# Patient Record
Sex: Female | Born: 1995 | Race: White | Hispanic: No | Marital: Married | Smoking: Never smoker
Health system: Southern US, Community
[De-identification: ages and names within clinical notes are randomized; demographics above are authoritative.]

---

## 2017-10-03 ENCOUNTER — Emergency Department (HOSPITAL_COMMUNITY): Payer: Managed Care, Other (non HMO)

## 2017-10-03 ENCOUNTER — Encounter (HOSPITAL_COMMUNITY): Payer: Self-pay | Admitting: Emergency Medicine

## 2017-10-03 DIAGNOSIS — Y998 Other external cause status: Secondary | ICD-10-CM | POA: Diagnosis not present

## 2017-10-03 DIAGNOSIS — S199XXA Unspecified injury of neck, initial encounter: Secondary | ICD-10-CM | POA: Diagnosis present

## 2017-10-03 DIAGNOSIS — Y9389 Activity, other specified: Secondary | ICD-10-CM | POA: Diagnosis not present

## 2017-10-03 DIAGNOSIS — S161XXA Strain of muscle, fascia and tendon at neck level, initial encounter: Secondary | ICD-10-CM | POA: Insufficient documentation

## 2017-10-03 DIAGNOSIS — R55 Syncope and collapse: Secondary | ICD-10-CM | POA: Insufficient documentation

## 2017-10-03 DIAGNOSIS — W500XXA Accidental hit or strike by another person, initial encounter: Secondary | ICD-10-CM | POA: Insufficient documentation

## 2017-10-03 DIAGNOSIS — Y9289 Other specified places as the place of occurrence of the external cause: Secondary | ICD-10-CM | POA: Insufficient documentation
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
## 2017-10-04 ENCOUNTER — Emergency Department (HOSPITAL_COMMUNITY)
Admission: EM | Admit: 2017-10-04 | Discharge: 2017-10-04 | Disposition: A | Discharge status: Home / Self Care | Payer: Managed Care, Other (non HMO) | Attending: Emergency Medicine | Admitting: Emergency Medicine

## 2017-10-04 DIAGNOSIS — S161XXA Strain of muscle, fascia and tendon at neck level, initial encounter: Secondary | ICD-10-CM

## 2017-10-04 DIAGNOSIS — R55 Syncope and collapse: Secondary | ICD-10-CM

## 2017-10-04 MED ORDER — IBUPROFEN 800 MG PO TABS
800.0000 mg | ORAL_TABLET | Freq: Once | ORAL | Status: AC
  Administered 2017-10-04: 800 mg via ORAL
  Filled 2017-10-04: qty 1

## 2017-10-04 NOTE — Discharge Instructions (Addendum)
Apply ice several times a day. Take ibuprofen, naproxen, or acetaminophen as needed for pain. °
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
## 2018-11-09 IMAGING — DX DG CERVICAL SPINE COMPLETE 4+V
6 series · 6 of 6 positions shown · non-contrast
Comparison: None.

CLINICAL DATA: Posterior neck pain after injury tonight.

EXAM:
CERVICAL SPINE - COMPLETE 4+ VIEW

[c-spine lat]
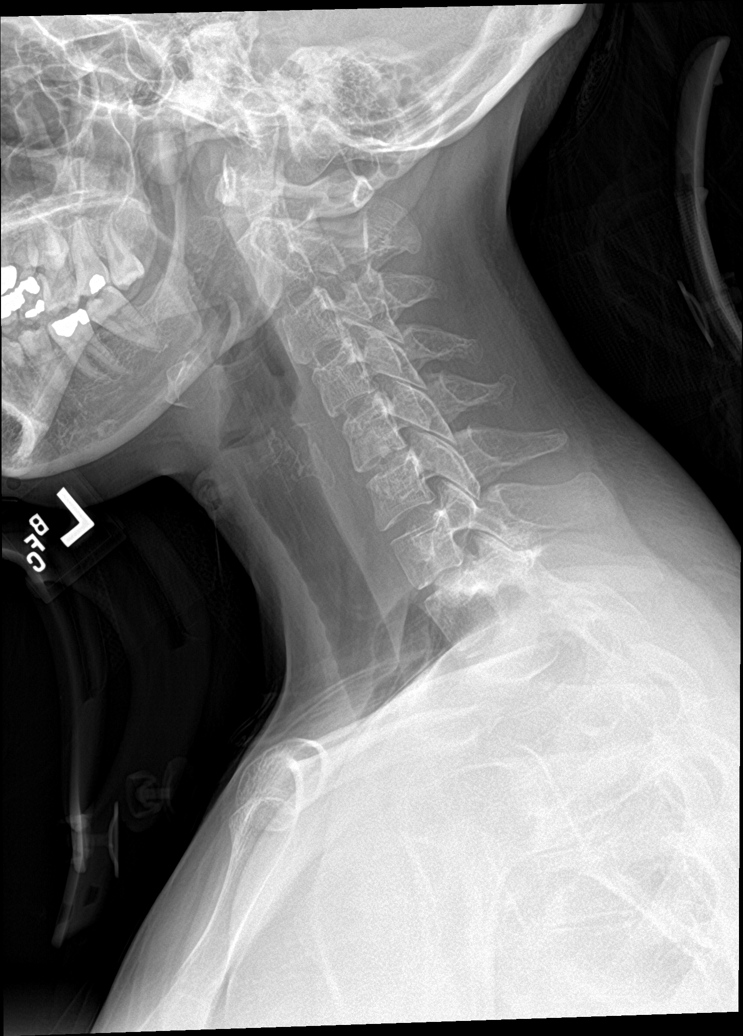

[c-spine obl (1 of 2)]
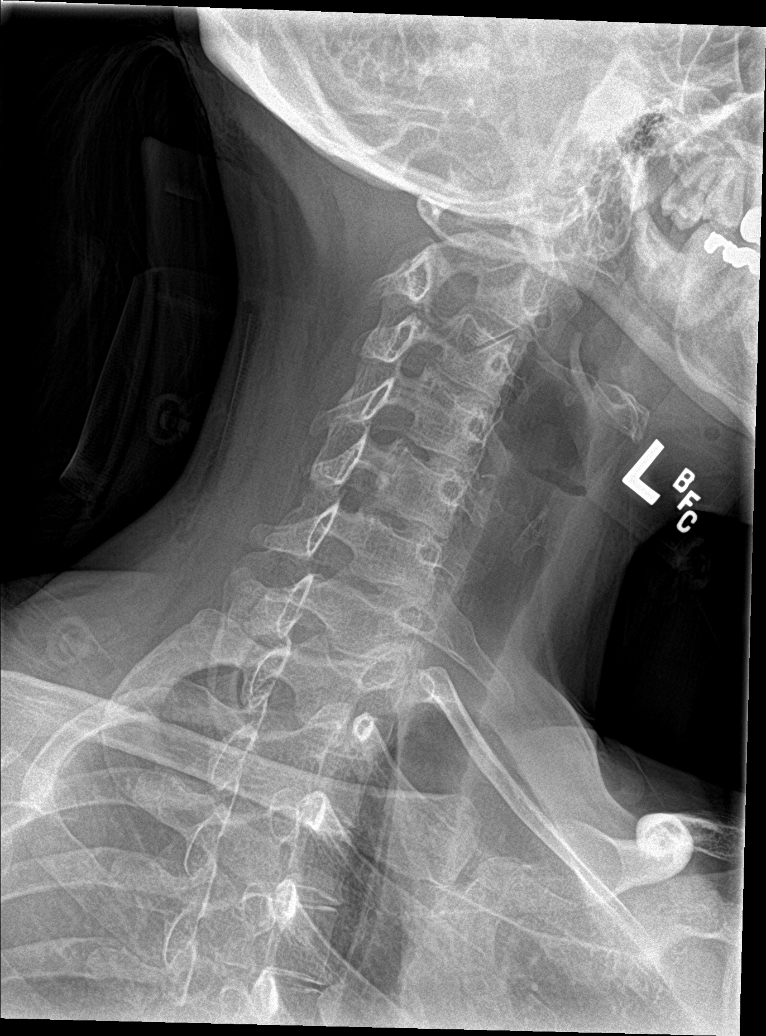

[c-spine obl (2 of 2)]
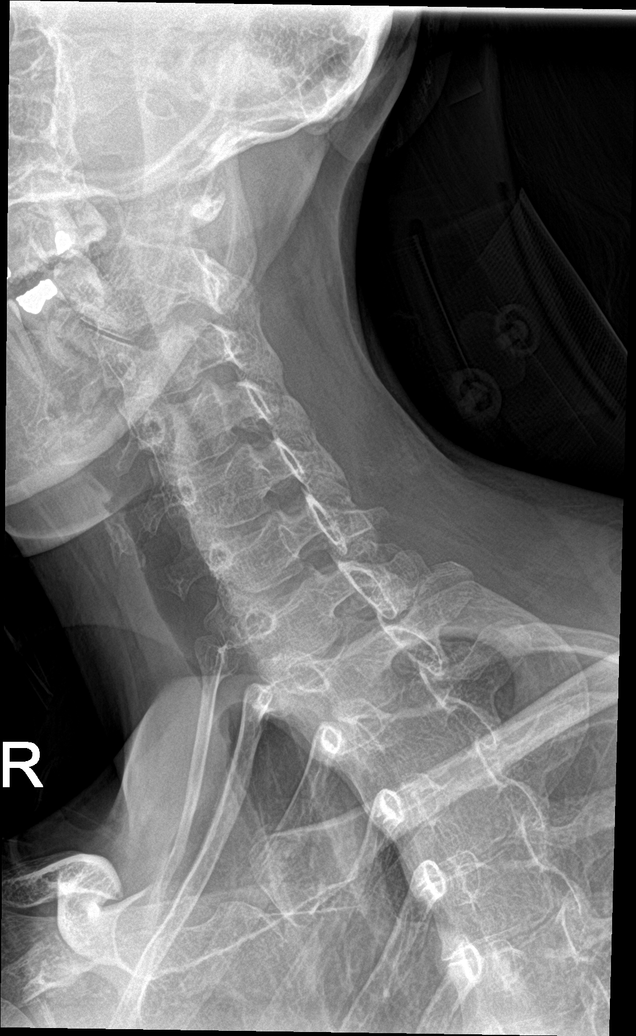

[c-spine ap]
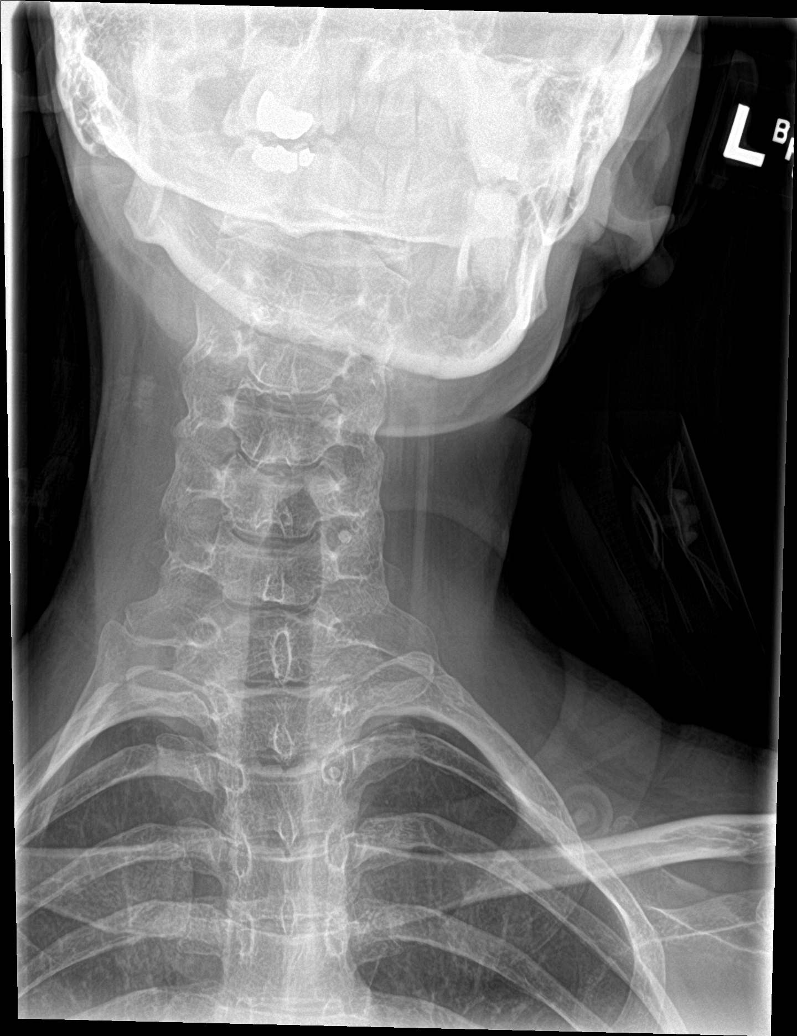

[c-spine open mouth]
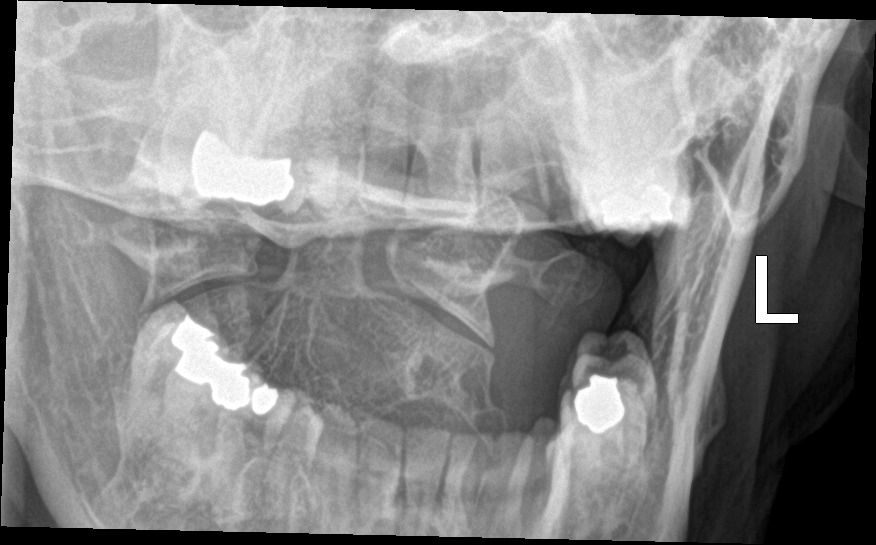

[[person_name]]
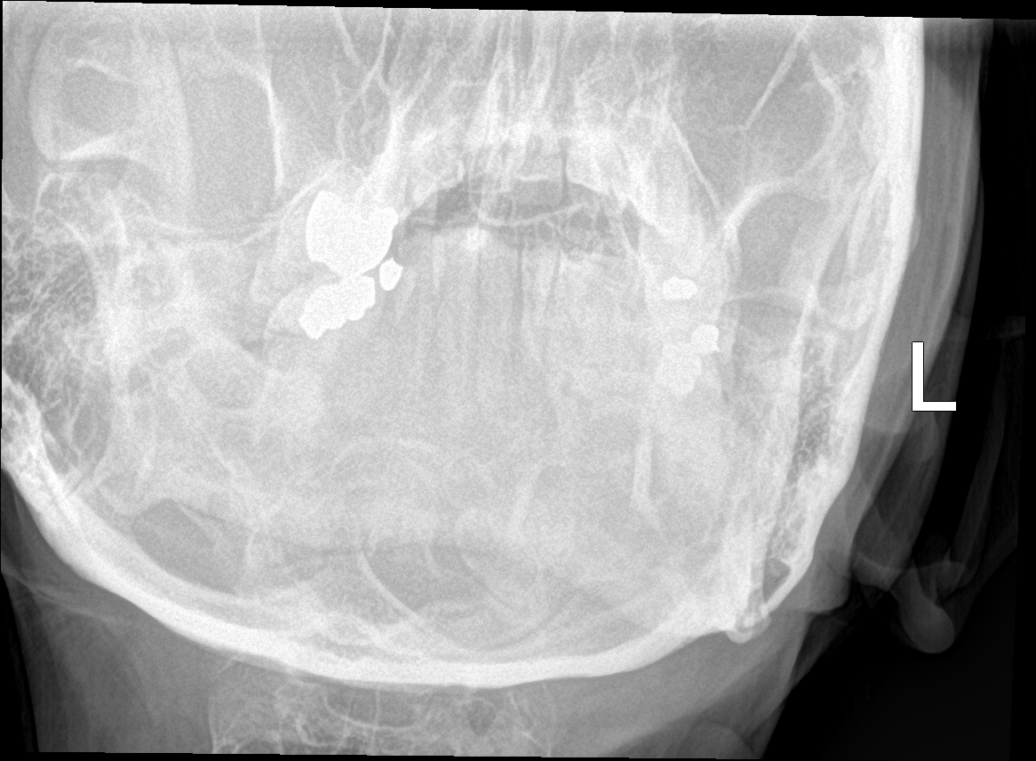

[6 of 6 positions shown; findings below may reference images not displayed]

FINDINGS: Straightening of usual cervical lordosis. This may be due to patient
positioning but ligamentous injury or muscle spasm could also have
this appearance and are not excluded. No anterior subluxation.
Normal alignment of the facet joints. No vertebral compression
deformities. No prevertebral soft tissue swelling. Intervertebral
disc space heights are preserved. No bone encroachment upon the
neural foramina. Lateral masses of C1 are symmetrical in the
odontoid process appears intact. Bilateral cervical ribs.
IMPRESSION: Nonspecific straightening of usual cervical lordosis. No acute
displaced fractures identified.
